# Patient Record
Sex: Female | Born: 1965 | Race: White | Hispanic: No | Marital: Married | Smoking: Never smoker
Health system: Southern US, Community
[De-identification: ages and names within clinical notes are randomized; demographics above are authoritative.]

## PROBLEM LIST (undated history)

## (undated) DIAGNOSIS — I10 Essential (primary) hypertension: Secondary | ICD-10-CM

## (undated) DIAGNOSIS — N63 Unspecified lump in unspecified breast: Secondary | ICD-10-CM

## (undated) DIAGNOSIS — N6099 Unspecified benign mammary dysplasia of unspecified breast: Principal | ICD-10-CM

## (undated) HISTORY — DX: Essential (primary) hypertension: I10

## (undated) HISTORY — DX: Unspecified lump in unspecified breast: N63.0

## (undated) HISTORY — DX: Unspecified benign mammary dysplasia of unspecified breast: N60.99

---

## 2007-07-08 HISTORY — PX: CHOLECYSTECTOMY: SHX55

## 2007-10-22 ENCOUNTER — Ambulatory Visit: Payer: Self-pay | Admitting: Unknown Physician Specialty

## 2007-10-27 ENCOUNTER — Ambulatory Visit: Payer: Self-pay | Admitting: Unknown Physician Specialty

## 2007-11-05 ENCOUNTER — Ambulatory Visit: Payer: Self-pay | Admitting: General Surgery

## 2007-11-10 ENCOUNTER — Ambulatory Visit: Payer: Self-pay | Admitting: General Surgery

## 2007-11-22 ENCOUNTER — Ambulatory Visit: Payer: Self-pay | Admitting: General Surgery

## 2009-11-06 ENCOUNTER — Ambulatory Visit: Payer: Self-pay

## 2009-12-23 ENCOUNTER — Ambulatory Visit: Payer: Self-pay | Admitting: Internal Medicine

## 2011-03-17 ENCOUNTER — Ambulatory Visit: Payer: Self-pay

## 2011-07-08 DIAGNOSIS — I10 Essential (primary) hypertension: Secondary | ICD-10-CM

## 2011-07-08 DIAGNOSIS — N6099 Unspecified benign mammary dysplasia of unspecified breast: Secondary | ICD-10-CM

## 2011-07-08 HISTORY — DX: Unspecified benign mammary dysplasia of unspecified breast: N60.99

## 2011-07-08 HISTORY — DX: Essential (primary) hypertension: I10

## 2012-03-11 ENCOUNTER — Emergency Department: Payer: Self-pay | Admitting: Emergency Medicine

## 2012-04-15 ENCOUNTER — Ambulatory Visit: Payer: Self-pay

## 2012-04-29 ENCOUNTER — Ambulatory Visit: Payer: Self-pay

## 2012-05-07 HISTORY — PX: BREAST BIOPSY: SHX20

## 2012-05-17 ENCOUNTER — Ambulatory Visit: Payer: Self-pay | Admitting: General Surgery

## 2012-05-20 LAB — PATHOLOGY REPORT

## 2012-06-11 ENCOUNTER — Ambulatory Visit: Payer: Self-pay | Admitting: General Surgery

## 2012-06-11 DIAGNOSIS — N6099 Unspecified benign mammary dysplasia of unspecified breast: Secondary | ICD-10-CM | POA: Insufficient documentation

## 2012-06-11 HISTORY — PX: BREAST SURGERY: SHX581

## 2012-06-11 HISTORY — PX: BREAST EXCISIONAL BIOPSY: SUR124

## 2012-11-08 ENCOUNTER — Encounter: Payer: Self-pay | Admitting: *Deleted

## 2012-11-15 ENCOUNTER — Telehealth: Payer: Self-pay | Admitting: *Deleted

## 2012-11-15 NOTE — Telephone Encounter (Signed)
Message copied by Nicholes Mango on Mon Nov 15, 2012  2:06 PM ------      Message from: [place], [place]      Created: Sun Nov 14, 2012  4:53 PM       Patient is due for f/u mammogram of the right breast in June, 2014. Review of 12/ 13 notes in [employer] showed she did not provide phone f/u in January as requested.  ------

## 2012-11-15 NOTE — Telephone Encounter (Signed)
Patient called back to let us know she is taking the Tamoxifen and aspirin. This patient states she forgot to call us in January 2014. She reports she has been having issue with night sweats but states the benefits outweigh the risks. Patient will follow up in the office as scheduled for June 2014.

## 2012-11-15 NOTE — Telephone Encounter (Signed)
Message has been left for the patient to call the office.

## 2012-11-15 NOTE — Telephone Encounter (Signed)
Patient reports that she is taking Tamoxifen as requested. Notice had been received from insurance that RX not filled. May have been less expensive w/o insurance at [facility]. F/U scheduled for next month.

## 2012-12-20 ENCOUNTER — Ambulatory Visit: Payer: Self-pay | Admitting: General Surgery

## 2012-12-28 ENCOUNTER — Encounter: Payer: Self-pay | Admitting: General Surgery

## 2012-12-28 ENCOUNTER — Ambulatory Visit: Payer: Self-pay | Admitting: General Surgery

## 2013-01-05 ENCOUNTER — Ambulatory Visit: Payer: Self-pay | Admitting: General Surgery

## 2013-01-20 ENCOUNTER — Encounter: Payer: Self-pay | Admitting: *Deleted

## 2013-01-31 ENCOUNTER — Ambulatory Visit: Payer: Self-pay | Admitting: General Surgery

## 2013-02-28 ENCOUNTER — Encounter: Payer: Self-pay | Admitting: General Surgery

## 2013-02-28 ENCOUNTER — Ambulatory Visit (INDEPENDENT_AMBULATORY_CARE_PROVIDER_SITE_OTHER): Payer: BC Managed Care – PPO | Admitting: General Surgery

## 2013-02-28 VITALS — BP 120/80 | HR 76 | Resp 12 | Ht 62.0 in | Wt 153.0 lb

## 2013-02-28 DIAGNOSIS — N6099 Unspecified benign mammary dysplasia of unspecified breast: Secondary | ICD-10-CM

## 2013-02-28 DIAGNOSIS — N6089 Other benign mammary dysplasias of unspecified breast: Secondary | ICD-10-CM

## 2013-02-28 NOTE — Patient Instructions (Signed)
Patient to return in six months bilateral mammogram.

## 2013-02-28 NOTE — Progress Notes (Signed)
Patient ID: Stephanie Warner, female   DOB: Jul 05, 1966, 47 y.o.   MRN: [id number]  No chief complaint on file.   HPI Stephanie Warner is a 47 y.o. female who presents for a breast evaluation. The most recent right breast mammogram was done on 12/28/12 cat 2. Patient does perform regular self breast checks and gets regular mammograms done.   The patient had previously undergone a stereotactic biopsy showing ADH. She subsequently underwent wide excision on June 11, 2012. No upstaging was noted.  The patient reports moderate vasomotor symptoms.  HPI  Past Medical History  Diagnosis Date  . Hypertension 2013  . Lump or mass in breast     Past Surgical History  Procedure Laterality Date  . Cholecystectomy  2009    Family History  Problem Relation Age of Onset  . Breast cancer Maternal Aunt 60  . Breast cancer Paternal Aunt 58    Social History History  Substance Use Topics  . Smoking status: Never Smoker   . Smokeless tobacco: Never Used  . Alcohol Use: Yes    No Known Allergies  Current Outpatient Prescriptions  Medication Sig Dispense Refill  . aspirin 81 MG tablet Take 81 mg by mouth daily.      . AZOR 5-40 MG per tablet Take 1 tablet by mouth daily.      . tamoxifen (NOLVADEX) 20 MG tablet Take 1 tablet by mouth daily at 6 (six) AM.       No current facility-administered medications for this visit.    Review of Systems Review of Systems  Constitutional: Negative.   Respiratory: Negative.   Cardiovascular: Negative.     Blood pressure 120/80, pulse 76, resp. rate 12, height 5\' 2"  (1.575 m), weight 153 lb (69.4 kg).  Physical Exam Physical Exam  Vitals reviewed. Constitutional: She is oriented to person, place, and time. She appears well-developed and well-nourished.  Cardiovascular: Normal rate, regular rhythm and normal heart sounds.   Pulmonary/Chest: Breath sounds normal. Right breast exhibits no inverted nipple, no mass, no nipple discharge, no skin  change and no tenderness. Left breast exhibits no inverted nipple, no mass, no nipple discharge, no skin change and no tenderness.  Right breast well healed incision 9 o'clock.  Lymphadenopathy:    She has no cervical adenopathy.    She has no axillary adenopathy.  Neurological: She is alert and oriented to person, place, and time.  Skin: Skin is warm and dry.    Data Reviewed Right breast mammogram dated December 28, 2012 showed no interval change. Scattered microcalcifications are noted in the upper outer quadrant. BI-RAD-2.  Assessment    Atypical ductal hyperplasia, on chemoprevention with tamoxifen.     Plan    All in all the patient is doing well. No indication for for change to Evista at this time.   We'll arrange for bilateral diagnostic mammograms in December 2014 with a brief office visit to follow.       Earline Mayotte 02/28/2013, 10:11 PM

## 2013-03-10 ENCOUNTER — Encounter: Payer: Self-pay | Admitting: General Surgery

## 2013-07-13 ENCOUNTER — Other Ambulatory Visit: Payer: Self-pay | Admitting: General Surgery

## 2013-07-16 IMAGING — US ULTRASOUND RIGHT BREAST
1 series · 14 of 25 positions shown · non-contrast
Comparison: none

REASON FOR EXAM: av rt nocularity and calcs
COMMENTS:

[Series 1: ultrasound right breast · 0.08mm/px · 14 of 40 slices shown]
[im 1/40]
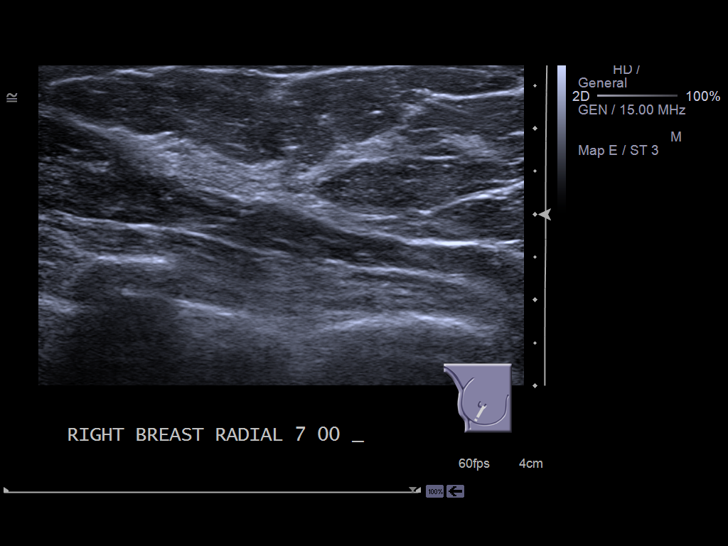
[im 4/40]
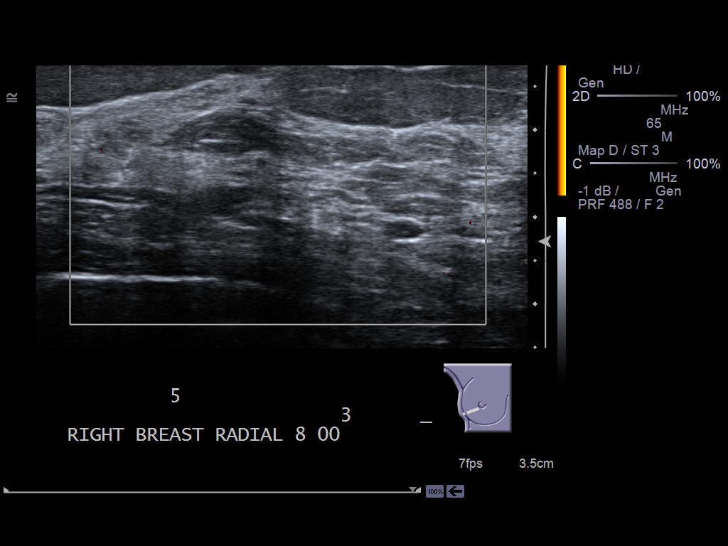
[im 7/40]
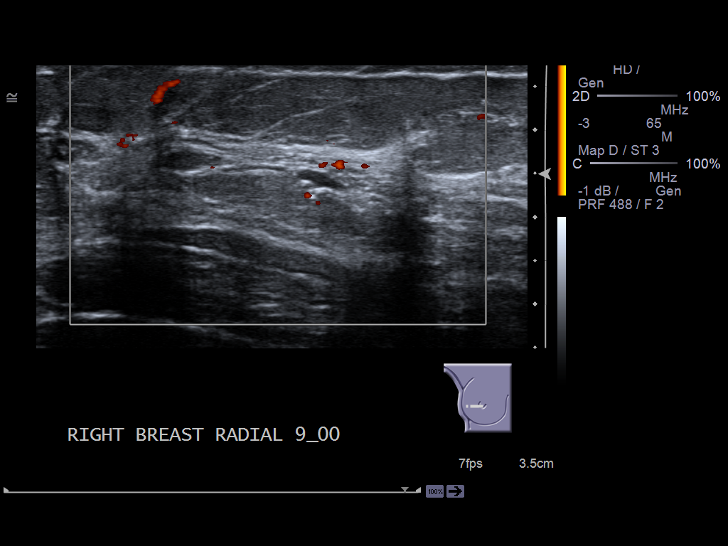
[im 10/40]
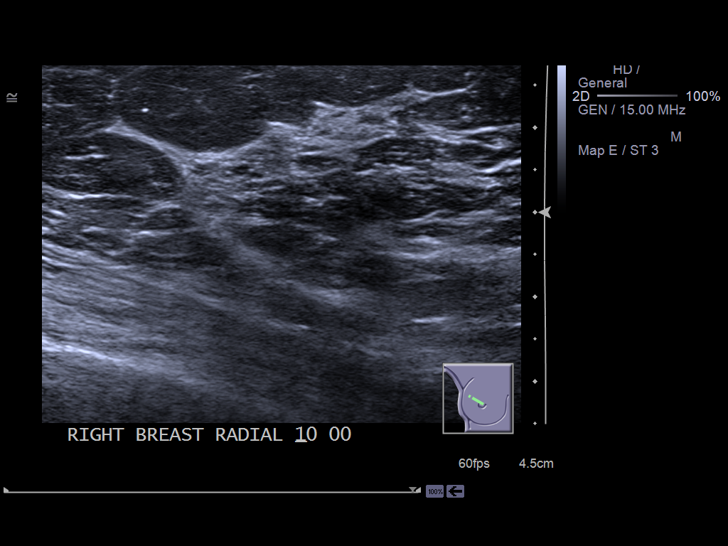
[im 14/40]
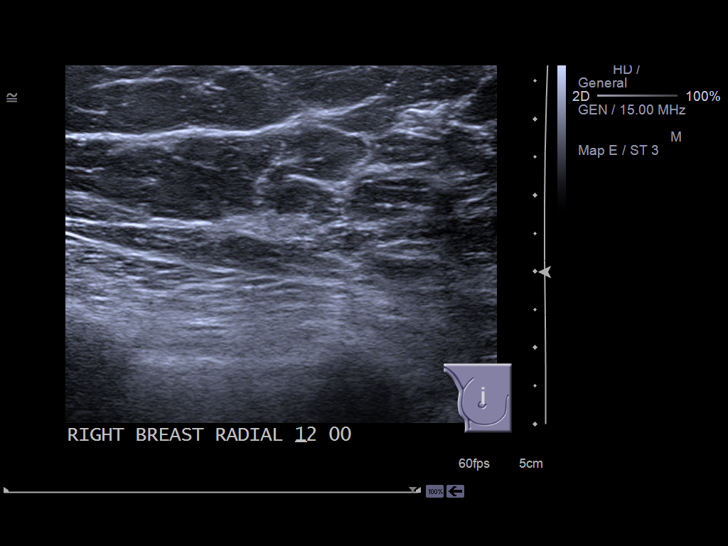
[im 15/40]
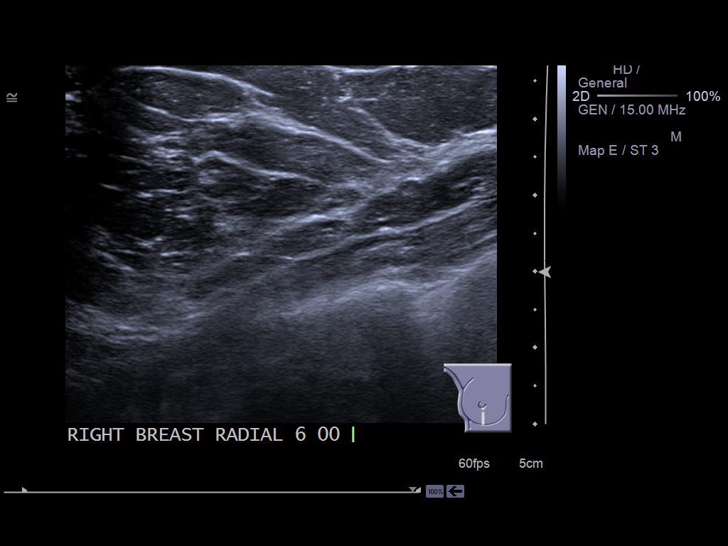
[im 18/40]
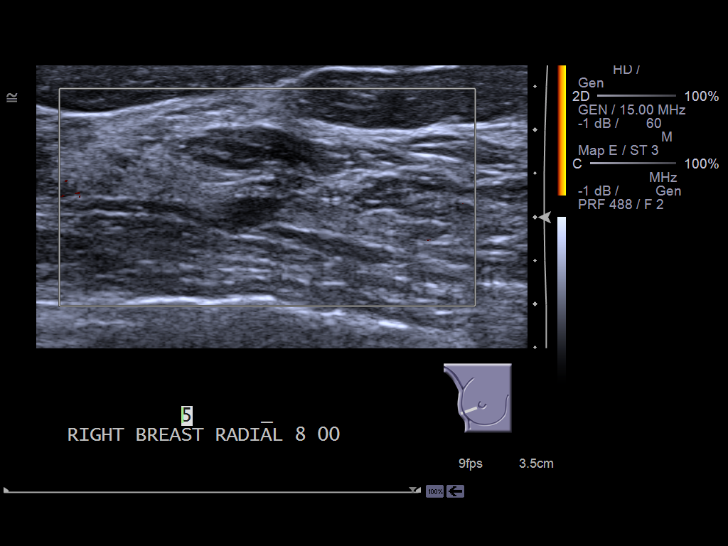
[im 22/40]
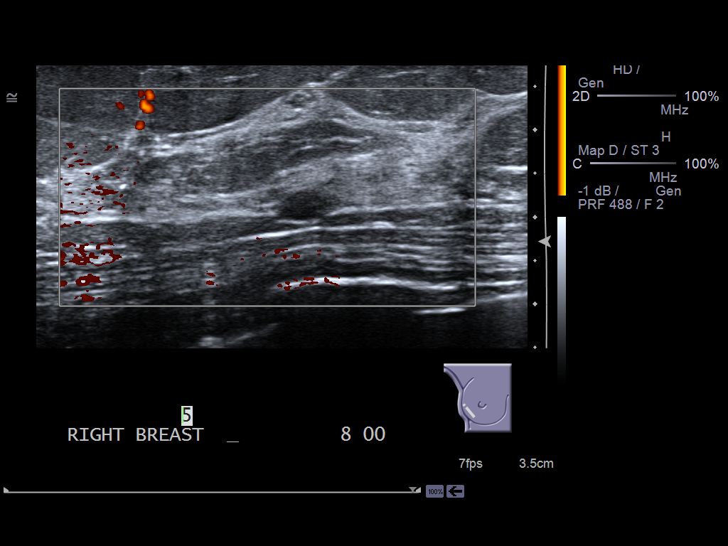
[im 25/40]
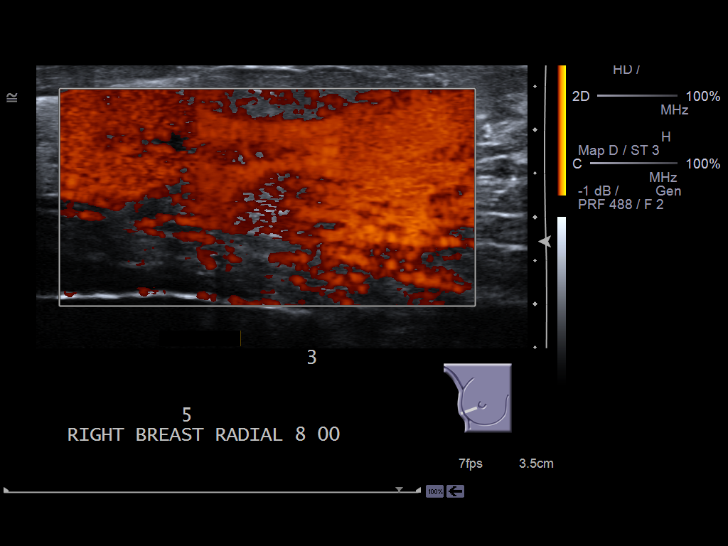
[im 27/40]
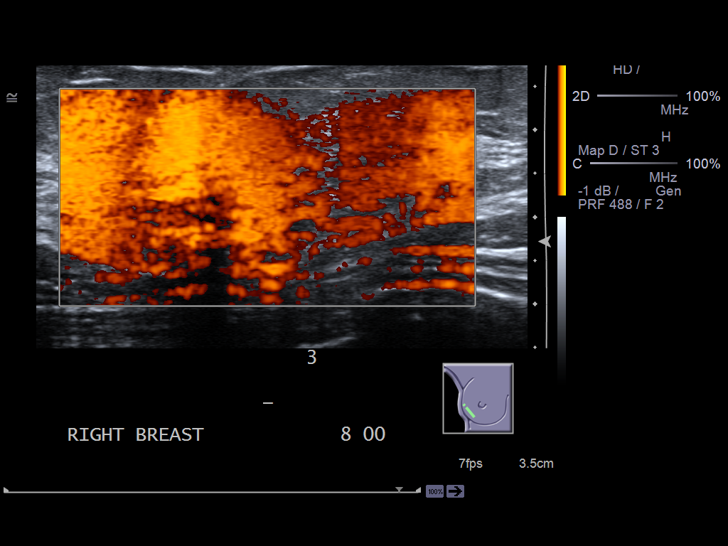
[im 30/40]
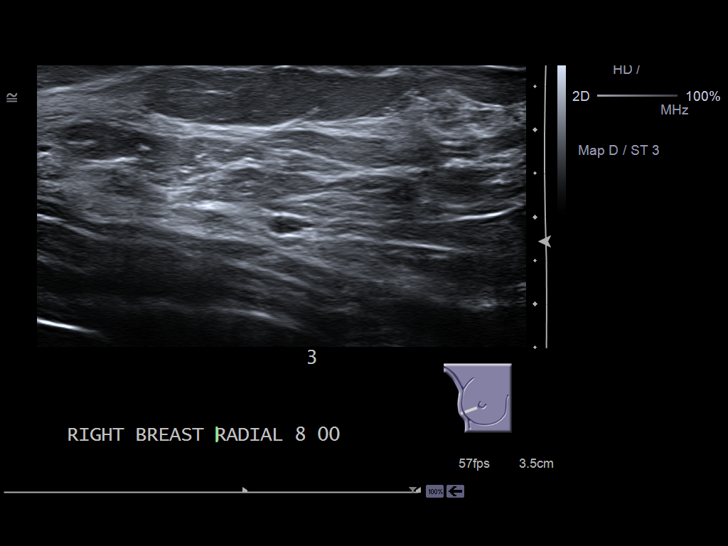
[im 33/40]
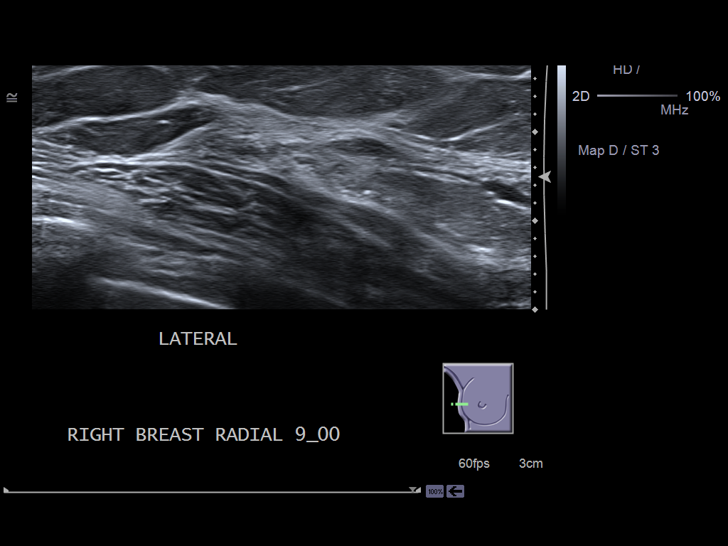
[im 36/40]
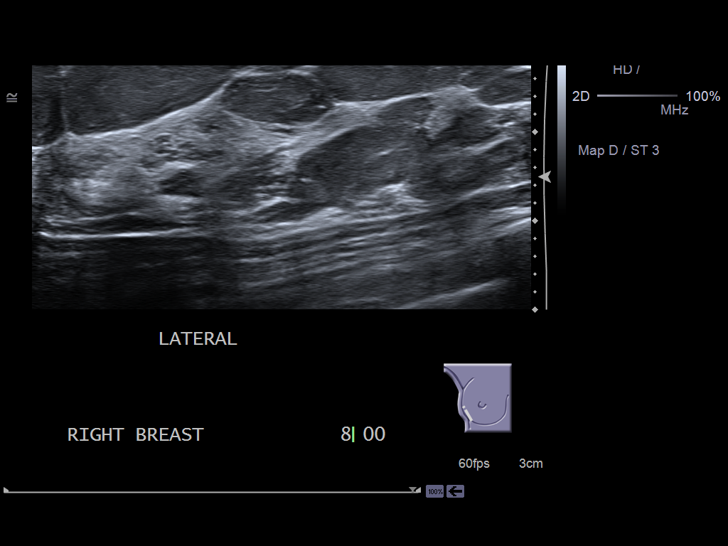
[im 40/40]
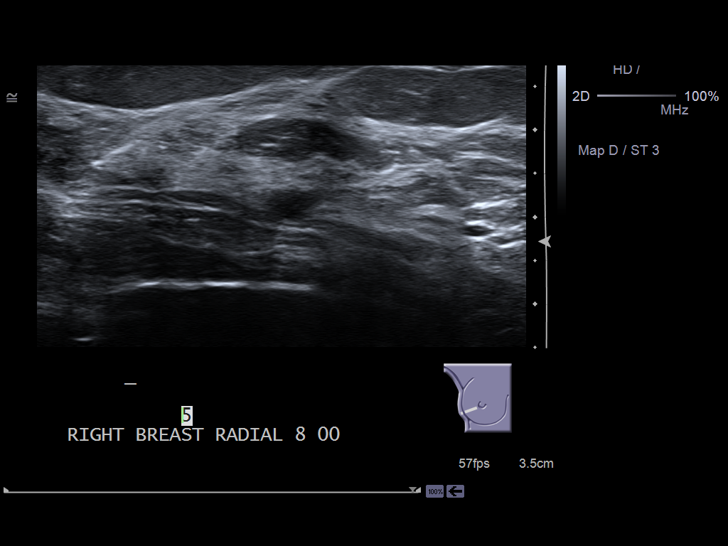

[14 of 25 positions shown; findings below may reference images not displayed]

PROCEDURE:     US  - US BREAST RIGHT  - April 29, 2012 [DATE]

RESULT:     The patient underwent ultrasound directed to the lateral aspect
of the right breast. At the [DATE] position there are 2 hypoechoic
nonshadowing nodules demonstrated. These are ovoid and appear broader than
they are tall. One measures 7 x 7 x 4 mm. The second measures 4 x 5 x 2 mm.
IMPRESSION: There are are indeterminate hypoechoic nodules in the
lateral aspect of the right breast. Please see the dictation of the
diagnostic mammogram of the same day for final recommendations BI-RADS
classification.

Dictation site:1

## 2013-07-19 ENCOUNTER — Ambulatory Visit: Payer: Self-pay | Admitting: General Surgery

## 2013-07-20 ENCOUNTER — Encounter: Payer: Self-pay | Admitting: General Surgery

## 2013-07-22 ENCOUNTER — Telehealth: Payer: Self-pay | Admitting: *Deleted

## 2013-07-22 NOTE — Telephone Encounter (Signed)
Patient notified as instructed. We will see her in the office on 08-03-13 for evaluation after additional [facility] which [facility].   She is penciled in for a stereo biopsy [facility]. We will wait to schedule with [facility] once Dr. Lemar LivingsByrnett sees the patient.

## 2013-07-22 NOTE — Telephone Encounter (Signed)
Left message on home and cell numbers for patient to call the office.  Mar[facility]mand[facility] at [facility] has scheduled patient's additional views for 07-28-13 at 8:40 am. Patient is currently on the schedule for an appointment on Monday, 07-25-13.   We could possibly complete stereo biopsy on 08-08-13 if patient agreeable.

## 2013-07-22 NOTE — Telephone Encounter (Signed)
Message copied by Nicholes MangoBAILEY, Letonya Mangels J on Fri Jul 22, 2013 10:56 AM ------      Message from: [place]      Created: Fri Jul 22, 2013  9:03 AM       [facility] and see if [facility] can do additional views on Monday, Jan 19 AM. Notify patient I think she will need a repeat stereo biopsy, which could be done Monday, PM. If they [facility] is unable to do the additional views Monday AM, and the patient wants to proceed with biopsy, that would be OK.  ------

## 2013-07-25 ENCOUNTER — Ambulatory Visit: Payer: BC Managed Care – PPO | Admitting: General Surgery

## 2013-07-27 ENCOUNTER — Ambulatory Visit: Payer: Self-pay | Admitting: General Surgery

## 2013-07-28 ENCOUNTER — Encounter: Payer: Self-pay | Admitting: General Surgery

## 2013-08-03 ENCOUNTER — Ambulatory Visit (INDEPENDENT_AMBULATORY_CARE_PROVIDER_SITE_OTHER): Payer: BC Managed Care – PPO | Admitting: General Surgery

## 2013-08-03 ENCOUNTER — Encounter: Payer: Self-pay | Admitting: General Surgery

## 2013-08-03 VITALS — BP 160/88 | HR 86 | Resp 14 | Ht 62.0 in | Wt 165.0 lb

## 2013-08-03 DIAGNOSIS — N6099 Unspecified benign mammary dysplasia of unspecified breast: Secondary | ICD-10-CM

## 2013-08-03 DIAGNOSIS — N6089 Other benign mammary dysplasias of unspecified breast: Secondary | ICD-10-CM

## 2013-08-03 NOTE — Progress Notes (Signed)
Patient ID: Stephanie Warner, female   DOB: 1966/06/07, 48 y.o.   MRN: [id number]  Chief Complaint  Patient presents with  . Follow-up    bilateral diagnostic mammogram     HPI Stephanie Warner is a 48 y.o. female who presents for a follow up breast exam. Her most recent mammogram was done on 07/27/13. The patient does self breast checks regularly and gets regular mammograms done. Besides soreness she denies any other issues with her breasts at this time.   HPI  Past Medical History  Diagnosis Date  . Hypertension 2013  . Lump or mass in breast   . Atypical ductal hyperplasia of breast 2013    Past Surgical History  Procedure Laterality Date  . Cholecystectomy  2009  . Breast surgery Right 06-11-12    ADH, columnar cell change. Margins were clear. Biopsy prompted by stereotactic biopsy showing ADH.    Family History  Problem Relation Age of Onset  . Breast cancer Maternal Aunt 60  . Breast cancer Paternal Aunt 30    Social History History  Substance Use Topics  . Smoking status: Never Smoker   . Smokeless tobacco: Never Used  . Alcohol Use: Yes    No Known Allergies  Current Outpatient Prescriptions  Medication Sig Dispense Refill  . amoxicillin (AMOXIL) 875 MG tablet       . aspirin 81 MG tablet Take 81 mg by mouth daily.      . AZOR 5-40 MG per tablet Take 1 tablet by mouth daily.      . chlorhexidine (PERIDEX) 0.12 % solution       . fluconazole (DIFLUCAN) 150 MG tablet       . tamoxifen (NOLVADEX) 20 MG tablet Take one tablet by mouth one time daily  30 tablet  10   No current facility-administered medications for this visit.    Review of Systems Review of Systems  Constitutional: Negative.   Respiratory: Negative.   Cardiovascular: Negative.     Blood pressure 160/88, pulse 86, resp. rate 14, height 5\' 2"  (1.575 m), weight 165 lb (74.844 kg).  Physical Exam Physical Exam  Constitutional: She is oriented to person, place, and time. She appears  well-developed and well-nourished.  Neck: Neck supple. No thyromegaly present.  Cardiovascular: Normal rate, regular rhythm and normal heart sounds.   No murmur heard. Pulmonary/Chest: Effort normal and breath sounds normal. Right breast exhibits no inverted nipple, no mass, no nipple discharge, no skin change and no tenderness. Left breast exhibits no inverted nipple, no mass, no nipple discharge, no skin change and no tenderness.  Lymphadenopathy:    She has no cervical adenopathy.    She has no axillary adenopathy.  Neurological: She is alert and oriented to person, place, and time.  Skin: Skin is warm and dry.    Data Reviewed Bilateral mammograms dated July 19, 2013 were reviewed. Microcalcifications were identified in the right breast for which additional views were requested.  Focal spot compression views of the right breast a July 27, 2013 were reviewed. This showed a 2 cm group of microcalcifications in the breast new from June 2014. Dystrophic appearance. Impression was probably benign breast calcifications representing fat necrosis, BI-RAD-3.  Independent review the films shows a marked change over the past 6 months. It would be unusual for this to develop in a short interval of time in the 6-12 months after her original wide excision. I'm concerned about possible additional ADH.  Assessment    New right breast  calcifications in area of previous ADH.     Plan    The patient was aware that there is a discordance between the radiologist's interpretation my note. Options for management include 6 month followup vs  repeat stereotactic biopsy.  At this time the patient desires to proceed repeat biopsy.  We'll plan to schedule this at the next available date (August 08, 2013).        Stephanie MayotteByrnett, Stephanie Warner W 08/03/2013, 10:03 PM

## 2013-08-03 NOTE — Patient Instructions (Signed)
Patient to proceed with stereotactic breast biopsy. Patient to contact our office with any questions or concerns.

## 2013-08-08 ENCOUNTER — Ambulatory Visit: Payer: Self-pay | Admitting: General Surgery

## 2013-08-08 DIAGNOSIS — R920 Mammographic microcalcification found on diagnostic imaging of breast: Secondary | ICD-10-CM

## 2013-08-08 HISTORY — PX: BREAST BIOPSY: SHX20

## 2013-08-10 ENCOUNTER — Telehealth: Payer: Self-pay | Admitting: General Surgery

## 2013-08-10 LAB — PATHOLOGY REPORT

## 2013-08-10 NOTE — Telephone Encounter (Signed)
Message left biopsy OK, would review at follow up appt.

## 2013-08-11 ENCOUNTER — Encounter: Payer: Self-pay | Admitting: General Surgery

## 2013-08-11 ENCOUNTER — Ambulatory Visit: Payer: BC Managed Care – PPO | Admitting: General Surgery

## 2013-08-15 ENCOUNTER — Ambulatory Visit (INDEPENDENT_AMBULATORY_CARE_PROVIDER_SITE_OTHER): Payer: BC Managed Care – PPO | Admitting: *Deleted

## 2013-08-15 DIAGNOSIS — N6089 Other benign mammary dysplasias of unspecified breast: Secondary | ICD-10-CM

## 2013-08-15 DIAGNOSIS — N6099 Unspecified benign mammary dysplasia of unspecified breast: Secondary | ICD-10-CM

## 2013-08-15 NOTE — Progress Notes (Signed)
Patient here today for follow up post breast biopsy.  Dressing removed, steristrip in place and aware it may come off in one week.  Minimal bruising noted.  The patient is aware that a heating pad may be used for comfort as needed.  Aware of pathology. Follow up as scheduled.  

## 2013-08-15 NOTE — Patient Instructions (Signed)
Patient to return in 6 months with right breast diagnostic mammogram.

## 2013-08-22 ENCOUNTER — Encounter: Payer: Self-pay | Admitting: General Surgery

## 2014-01-11 ENCOUNTER — Ambulatory Visit: Payer: BC Managed Care – PPO | Admitting: General Surgery

## 2014-01-31 ENCOUNTER — Ambulatory Visit: Payer: BC Managed Care – PPO | Admitting: General Surgery

## 2014-02-20 ENCOUNTER — Encounter: Payer: Self-pay | Admitting: General Surgery

## 2014-02-21 ENCOUNTER — Ambulatory Visit: Payer: BC Managed Care – PPO | Admitting: General Surgery

## 2014-02-23 ENCOUNTER — Ambulatory Visit: Payer: BC Managed Care – PPO | Admitting: General Surgery

## 2014-03-09 ENCOUNTER — Encounter: Payer: Self-pay | Admitting: General Surgery

## 2014-03-09 ENCOUNTER — Ambulatory Visit (INDEPENDENT_AMBULATORY_CARE_PROVIDER_SITE_OTHER): Payer: BC Managed Care – PPO | Admitting: General Surgery

## 2014-03-09 VITALS — BP 140/82 | HR 72 | Resp 14 | Ht 62.0 in | Wt 157.0 lb

## 2014-03-09 DIAGNOSIS — N6099 Unspecified benign mammary dysplasia of unspecified breast: Secondary | ICD-10-CM

## 2014-03-09 DIAGNOSIS — N6089 Other benign mammary dysplasias of unspecified breast: Secondary | ICD-10-CM

## 2014-03-09 NOTE — Patient Instructions (Signed)
The patient has been asked to return to the office in six months a bilateral diagnostic mammogram.

## 2014-03-09 NOTE — Progress Notes (Signed)
Patient ID: Stephanie Warner, female   DOB: [birth year], 48 y.o.   MRN: [id number]  Chief Complaint  Patient presents with  . Follow-up    mammogram    HPI Stephanie Warner is a 48 y.o. female who presents for a breast evaluation. The most recent right breast  mammogram was done on 02/16/14. Patient does perform regular self breast checks and gets regular mammograms done.  The patient continues to tolerate tamoxifen therapy well.  HPI  Past Medical History  Diagnosis Date  . Hypertension 2013  . Lump or mass in breast   . Atypical ductal hyperplasia of breast 2013    Past Surgical History  Procedure Laterality Date  . Cholecystectomy  2009  . Breast surgery Right 06-11-12    ADH, columnar cell change. Margins were clear. Biopsy prompted by stereotactic biopsy showing ADH.    Family History  Problem Relation Age of Onset  . Breast cancer Maternal Aunt 60  . Breast cancer Paternal Aunt 57    Social History History  Substance Use Topics  . Smoking status: Never Smoker   . Smokeless tobacco: Never Used  . Alcohol Use: Yes    No Known Allergies  Current Outpatient Prescriptions  Medication Sig Dispense Refill  . aspirin 81 MG tablet Take 81 mg by mouth daily.      . AZOR 5-40 MG per tablet Take 1 tablet by mouth daily.      . chlorhexidine (PERIDEX) 0.12 % solution       . fluconazole (DIFLUCAN) 150 MG tablet       . tamoxifen (NOLVADEX) 20 MG tablet Take one tablet by mouth one time daily  30 tablet  10   No current facility-administered medications for this visit.    Review of Systems Review of Systems  Constitutional: Negative.   Respiratory: Negative.   Cardiovascular: Negative.     Blood pressure 140/82, pulse 72, resp. rate 14, height  (1.575 m), weight 157 lb (71.215 kg).  Physical Exam Physical Exam  Constitutional: She is oriented to person, place, and time. She appears well-developed and well-nourished.  Eyes: Conjunctivae are normal. No  scleral icterus.  Neck: Neck supple.  Cardiovascular: Normal rate, regular rhythm and normal heart sounds.   Pulmonary/Chest: Effort normal and breath sounds normal. Right breast exhibits no inverted nipple, no mass, no nipple discharge, no skin change and no tenderness. Left breast exhibits no inverted nipple, no mass, no nipple discharge, no skin change and no tenderness.  Lymphadenopathy:    She has no cervical adenopathy.    She has no axillary adenopathy.  Neurological: She is alert and oriented to person, place, and time.  Skin: Skin is warm and dry.    Data Reviewed Right breast mammogram dated R. Is 13, 2015 shows a decrease in mammographic microcalcifications. Biopsy site is mammographically stable. BIRADS 4 based on previous identification of ADH.  This area has previously been resected with the findings of only a millimeter area of residual ADH. Reexcision is not indicated.  Assessment    Benign breast exam.     Plan    We'll plan for follow up examination with bilateral mammograms in 6 months.     PCP/Ref: Yves Dill 03/10/2014, 7:22 PM

## 2014-05-08 ENCOUNTER — Encounter: Payer: Self-pay | Admitting: General Surgery

## 2014-08-01 ENCOUNTER — Other Ambulatory Visit: Payer: Self-pay | Admitting: General Surgery

## 2014-08-28 ENCOUNTER — Encounter: Payer: Self-pay | Admitting: General Surgery

## 2014-08-29 ENCOUNTER — Ambulatory Visit: Payer: Self-pay | Admitting: General Surgery

## 2014-09-12 ENCOUNTER — Ambulatory Visit: Payer: Self-pay | Admitting: General Surgery

## 2014-09-20 ENCOUNTER — Ambulatory Visit: Payer: Self-pay | Admitting: General Surgery

## 2014-10-04 ENCOUNTER — Ambulatory Visit: Payer: Self-pay | Admitting: General Surgery

## 2014-10-24 NOTE — Op Note (Signed)
PATIENT NAME:  [name], Stephanie C MR#:  [id number] DATE OF BIRTH:  1965/07/24  DATE OF PROCEDURE:  06/11/2012  PREOPERATIVE DIAGNOSIS: Abnormal right breast biopsy with atypical intraductal proliferative lesion.  POSTOPERATIVE DIAGNOSIS: Abnormal right breast biopsy with atypical intraductal proliferative lesion.   OPERATIVE PROCEDURE: Wide local excision of the right breast to evaluate for DCIS.   OPERATING SURGEON: Earline MayotteJeffrey W. Byrnett, MD   ANESTHESIA: General endotracheal under Dr. Henrene HawkingKephart, Marcaine 0.5% plain, 30 mL local infiltration.   ESTIMATED BLOOD LOSS: Minimal.   CLINICAL NOTE: This is a 49 year old woman who underwent a stereotactic biopsy for microcalcifications. This showed an atypical intraductal proliferative lesion as well as associated luminal calcifications and columnar cell hyperplasia. 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 Wide excision was planned to clarify the pathology.   OPERATIVE NOTE: Ultrasound was used to confirm the area of the previous biopsy. ChloraPrep was applied and Marcaine infiltrated. The area was approached through a radial incision at the 9 o'clock position. A 3 x 3 x 4 cm block of tissue was excised, orientated, and specimen radiograph confirmed the previously placed biopsy clip in the lateral aspect of the tissue block. This was confirmed by the pathologist. Permanent sections were requested. The defect was closed in layers with running 2-0 Vicryl. The skin was closed with a running 4-0 Vicryl subcuticular suture. Benzoin and Steri-Strips followed by Telfa pad were applied. Fluff gauze, Kerlix, and an Ace wrap were then applied. The patient tolerated the procedure well and was taken to the recovery room in stable condition.  ____________________________ Earline MayotteJeffrey W. Byrnett, MD jwb:drc D: 06/11/2012 12:38:14 ET T: 06/11/2012 12:46:26 ET JOB#: [id number] cc: Earline MayotteJeffrey W. Byrnett, MD, <Dictator>, Danella PentonMark F. Miller, MD,  Cydney OkAngela R. Lugiano, CNM JEFFREY Brion AlimentW BYRNETT MD ELECTRONICALLY SIGNED 06/14/2012 19:46

## 2014-11-01 ENCOUNTER — Encounter: Payer: Self-pay | Admitting: Podiatry

## 2014-11-01 ENCOUNTER — Ambulatory Visit (INDEPENDENT_AMBULATORY_CARE_PROVIDER_SITE_OTHER): Payer: BLUE CROSS/BLUE SHIELD | Admitting: Podiatry

## 2014-11-01 ENCOUNTER — Ambulatory Visit (INDEPENDENT_AMBULATORY_CARE_PROVIDER_SITE_OTHER): Payer: BLUE CROSS/BLUE SHIELD

## 2014-11-01 VITALS — BP 134/76 | HR 86 | Resp 16

## 2014-11-01 DIAGNOSIS — S99921A Unspecified injury of right foot, initial encounter: Secondary | ICD-10-CM

## 2014-11-01 DIAGNOSIS — S9031XA Contusion of right foot, initial encounter: Secondary | ICD-10-CM

## 2014-11-01 NOTE — Progress Notes (Signed)
   Subjective:    Patient ID: Stephanie MaskerMarcella C Warner, female    DOB: 12/24/1965, 49 y.o.   MRN: [id number]  HPI Comments: "I hurt my foot"  Patient c/o aching dorsal foot right since Saturday (50 days). She was helping move wood and a piece dropped on top of her foot. The area is bruised and swollen. She is wearing a compression stocking and has been taking Ibuprofen and icing it.      Review of Systems  Musculoskeletal: Positive for back pain.  Allergic/Immunologic: Positive for environmental allergies.  All other systems reviewed and are negative.      Objective:   Physical Exam: I have reviewed her past medical history medications allergies surgery social history review of systems. Pulses are strongly palpable bilateral. Neurologic sensorium is intact per Semmes-Weinstein monofilament. Deep tendon reflexes are intact bilateral muscle strength +5 over 5 dorsiflexion plantar flexors and inverters and everters all intrinsic musculature is intact. Orthopedic evaluation does demonstrate a small hematoma approximately 4 cm in diameter which is nonpulsatile but is firm to the touch to the dorsal aspect overlying the neck of the talus and distal calcaneus. An area of bruising is noted to this site extending past the level of the ankle proximally. Radiographic evaluation does not demonstrates any type of osseus abnormalities in this area. She has full range of motion of all of her tendons and all of her joints without symptoms.        Assessment & Plan:  Assessment: Contusion dorsal aspect of the right foot.  Plan: Encouraged compression rest ice and elevation.

## 2014-12-06 ENCOUNTER — Encounter: Payer: Self-pay | Admitting: *Deleted

## 2015-05-09 ENCOUNTER — Ambulatory Visit: Payer: Self-pay | Admitting: General Surgery

## 2015-06-14 ENCOUNTER — Ambulatory Visit: Payer: Self-pay | Admitting: General Surgery

## 2015-08-07 ENCOUNTER — Other Ambulatory Visit: Payer: Self-pay | Admitting: *Deleted

## 2015-08-07 DIAGNOSIS — N6091 Unspecified benign mammary dysplasia of right breast: Secondary | ICD-10-CM

## 2015-08-13 ENCOUNTER — Other Ambulatory Visit: Payer: Self-pay | Admitting: *Deleted

## 2015-08-13 ENCOUNTER — Inpatient Hospital Stay
Admission: RE | Admit: 2015-08-13 | Discharge: 2015-08-13 | Disposition: A | Discharge status: Home / Self Care | Payer: Self-pay | Source: Ambulatory Visit | Attending: *Deleted | Admitting: *Deleted

## 2015-08-13 DIAGNOSIS — Z9289 Personal history of other medical treatment: Secondary | ICD-10-CM

## 2015-08-15 ENCOUNTER — Encounter: Payer: Self-pay | Admitting: *Deleted

## 2015-10-08 ENCOUNTER — Ambulatory Visit
Admission: RE | Admit: 2015-10-08 | Discharge: 2015-10-08 | Disposition: A | Discharge status: Home / Self Care | Payer: BLUE CROSS/BLUE SHIELD | Source: Ambulatory Visit | Attending: General Surgery | Admitting: General Surgery

## 2015-10-08 ENCOUNTER — Other Ambulatory Visit: Payer: Self-pay | Admitting: General Surgery

## 2015-10-08 DIAGNOSIS — R921 Mammographic calcification found on diagnostic imaging of breast: Secondary | ICD-10-CM | POA: Diagnosis not present

## 2015-10-08 DIAGNOSIS — N6091 Unspecified benign mammary dysplasia of right breast: Secondary | ICD-10-CM | POA: Insufficient documentation

## 2015-10-16 ENCOUNTER — Encounter: Payer: Self-pay | Admitting: General Surgery

## 2015-10-16 ENCOUNTER — Ambulatory Visit: Payer: BLUE CROSS/BLUE SHIELD | Admitting: General Surgery

## 2015-10-16 ENCOUNTER — Ambulatory Visit (INDEPENDENT_AMBULATORY_CARE_PROVIDER_SITE_OTHER): Payer: BLUE CROSS/BLUE SHIELD | Admitting: General Surgery

## 2015-10-16 VITALS — BP 144/78 | HR 74 | Resp 12 | Ht 62.0 in | Wt 150.0 lb

## 2015-10-16 DIAGNOSIS — N62 Hypertrophy of breast: Secondary | ICD-10-CM | POA: Diagnosis not present

## 2015-10-16 DIAGNOSIS — N6099 Unspecified benign mammary dysplasia of unspecified breast: Secondary | ICD-10-CM

## 2015-10-16 NOTE — Patient Instructions (Signed)
The patient has been asked to return to the office in one year with a bilateral diagnostic mammogram. 

## 2015-10-16 NOTE — Progress Notes (Addendum)
Patient ID: Stephanie Warner, female   DOB: Jan 28, 1966, 50 y.o.   MRN: [id number]  Chief Complaint  Patient presents with  . Follow-up    mammmogram     HPI Stephanie Warner is a 50 y.o. female who presents for a breast evaluation. The most recent mammogram was done on 10/08/15.  Patient does perform regular self breast checks and gets regular mammograms done. Doing well on Tamoxifen.   I personally reviewed the patient's history.  HPI  Past Medical History  Diagnosis Date  . Hypertension 2013  . Lump or mass in breast   . Atypical ductal hyperplasia of breast 2013    Past Surgical History  Procedure Laterality Date  . Cholecystectomy  2009  . Breast excisional biopsy Right 06/2012    surgery  . Breast surgery Right 06-11-12    ADH, columnar cell change. Margins were clear. Biopsy prompted by stereotactic biopsy showing ADH.  Marland Kitchen Breast biopsy Right 05/2012    atypical ductal hyperplasia  . Breast biopsy Right 08/2013    ADH ( 1mm) on stereo biopsy. No wide excision.     Family History  Problem Relation Age of Onset  . Breast cancer Maternal Aunt 60  . Breast cancer Paternal Aunt 67    Social History Social History  Substance Use Topics  . Smoking status: Never Smoker   . Smokeless tobacco: Never Used  . Alcohol Use: Yes    Allergies  Allergen Reactions  . Sulfa Antibiotics Rash    Current Outpatient Prescriptions  Medication Sig Dispense Refill  . amLODipine-olmesartan (AZOR) 10-40 MG tablet Take 1 tablet by mouth daily.    Marland Kitchen aspirin 81 MG tablet Take 81 mg by mouth daily.    Marland Kitchen Fexofenadine HCl (ALLEGRA PO) Take by mouth.    Marland Kitchen omeprazole (PRILOSEC) 20 MG capsule Take 20 mg by mouth daily.    . sertraline (ZOLOFT) 50 MG tablet Take 50 mg by mouth daily.    . tamoxifen (NOLVADEX) 20 MG tablet TAKE ONE TABLET BY MOUTH ONE TIME DAILY  30 tablet 9   No current facility-administered medications for this visit.    Review of Systems Review of Systems   Constitutional: Negative.   Respiratory: Negative.   Cardiovascular: Negative.     Blood pressure 144/78, pulse 74, resp. rate 12, height  (1.575 m), weight 150 lb (68.04 kg).  Physical Exam Physical Exam  Constitutional: She is oriented to person, place, and time. She appears well-developed and well-nourished.  Eyes: Conjunctivae are normal. No scleral icterus.  Neck: Neck supple.  Cardiovascular: Normal rate and regular rhythm.   Murmur heard.  Systolic murmur is present with a grade of 2/6  Pulmonary/Chest: Effort normal and breath sounds normal. Right breast exhibits no inverted nipple, no mass, no nipple discharge, no skin change and no tenderness. Left breast exhibits no inverted nipple, no mass, no nipple discharge, no skin change and no tenderness.  Right breast well healed incision at 9 o'clock  Lymphadenopathy:    She has no cervical adenopathy.    She has no axillary adenopathy.  Neurological: She is alert and oriented to person, place, and time.  Skin: Skin is warm and dry.    Data Reviewed PATHOLOGY REPORT  Pathology Report  .                [  Final Report     ]           Material submitted:                    .  RIGHT BREAST BIOPSY  .                [  Final Report     ]           Pre-operative diagnosis:                    .  RIGHT BREAST CALCS  .                [  Final Report     ]             Diagnosis:  RIGHT BREAST BIOPSY:  - SINGLE MINUTE (<1 MM) FOCUS OF ATYPICAL DUCTAL HYPERPLASIA. -  CHANGES CONSISTENT WITH PREVIOUS SURGICAL PROCEDURE WITH  ASSOCIATED MICROCALCIFICATIONS.  - NO EVIDENCE OF MALIGNANCY.  .  NOTE: Preliminary results of this case were discussed with Dr.  Lemar LivingsByrnett on August 09, 2013 by Dr. Excell SeltzerBaker. The final results are  unchanged.  XDB/08/10/2013   Assessment    Benign breast exam.    Plan     The radiologist had spoken to the patient about the fact that excisional biopsy was not completed after the stereotactic biopsy of February 2015 identifying a minute foci of ADH area considering the volume of tissue removed during the biopsy and the tiny area of ADH, I felt that the likelihood of upstaging to DCIS was incredibly small.  The patient is now discussing with her family whether she would be more comfortable having an excision of this area. In review of her mammograms a clip is sitting in a sea of fat, making a missed malignancy highly unlikely. If it will provide piece of mind needle localization open biopsy can be completed if she desires.  She has been on tamoxifen for chemoprevention since 2013 and tolerating this well. We'll plan to continue for at least another year.    The patient has been asked to return to the office in one year with a bilateral diagnostic mammogram.  The patient is of age for a screening colonoscopy. Pros and cons of this diagnostic study were reviewed. The patient will consider her options and let us know how she would like to proceed. PCP:  Hyacinth MeekerMiller This information has been scribed by Ples SpecterJessica Qualls CMA.    Earline MayotteByrnett, Drew Herman W 10/17/2015, 4:33 PM

## 2015-10-17 ENCOUNTER — Encounter: Payer: Self-pay | Admitting: General Surgery

## 2016-08-21 ENCOUNTER — Other Ambulatory Visit: Payer: Self-pay

## 2016-08-21 DIAGNOSIS — N6099 Unspecified benign mammary dysplasia of unspecified breast: Secondary | ICD-10-CM

## 2016-10-10 ENCOUNTER — Other Ambulatory Visit: Payer: BLUE CROSS/BLUE SHIELD

## 2016-10-16 ENCOUNTER — Encounter: Payer: Self-pay | Admitting: *Deleted

## 2016-10-17 ENCOUNTER — Other Ambulatory Visit: Payer: BLUE CROSS/BLUE SHIELD

## 2016-10-17 ENCOUNTER — Ambulatory Visit: Admission: RE | Admit: 2016-10-17 | Payer: BLUE CROSS/BLUE SHIELD | Source: Ambulatory Visit

## 2016-10-21 ENCOUNTER — Ambulatory Visit: Payer: BLUE CROSS/BLUE SHIELD | Admitting: General Surgery

## 2016-11-12 ENCOUNTER — Encounter: Payer: Self-pay | Admitting: *Deleted

## 2016-11-20 ENCOUNTER — Telehealth: Payer: Self-pay | Admitting: *Deleted

## 2016-11-20 ENCOUNTER — Other Ambulatory Visit: Payer: Self-pay | Admitting: General Surgery

## 2016-11-20 NOTE — Telephone Encounter (Signed)
I called the patient and she states she is still taking her tamoxifen (refill sent) and that she is having a had time making her mammogram appointment. I told her we would arrange this and let her know date, appreciates call. Appointment for Dr Lemar LivingsByrnett made for July 3.

## 2016-11-21 ENCOUNTER — Telehealth: Payer: Self-pay

## 2016-11-21 NOTE — Telephone Encounter (Signed)
Message left for patient. [facility] at [facility]:40 pm.

## 2016-11-21 NOTE — Telephone Encounter (Signed)
Patient notified

## 2016-12-18 ENCOUNTER — Ambulatory Visit
Admission: RE | Admit: 2016-12-18 | Discharge: 2016-12-18 | Disposition: A | Discharge status: Home / Self Care | Payer: Managed Care, Other (non HMO) | Source: Ambulatory Visit | Attending: General Surgery | Admitting: General Surgery

## 2016-12-18 DIAGNOSIS — N6099 Unspecified benign mammary dysplasia of unspecified breast: Secondary | ICD-10-CM

## 2016-12-30 ENCOUNTER — Encounter: Payer: Self-pay | Admitting: *Deleted

## 2017-01-06 ENCOUNTER — Ambulatory Visit: Payer: Self-pay | Admitting: General Surgery

## 2017-01-08 ENCOUNTER — Telehealth: Payer: Self-pay

## 2017-01-08 NOTE — Telephone Encounter (Signed)
Message left for the patient to call the office to reschedule her follow up appointment with Dr Lemar LivingsByrnett.

## 2017-04-16 ENCOUNTER — Ambulatory Visit: Payer: Managed Care, Other (non HMO) | Admitting: General Surgery

## 2017-04-27 ENCOUNTER — Telehealth: Payer: Self-pay | Admitting: *Deleted

## 2017-04-27 NOTE — Telephone Encounter (Signed)
Patient called stating she went to her orthopedic doctor and he wants to put her on oral Lamisil, a antifungal medication. She stated that the use of oral Lamisil can cause tamoxifen to not work like it should. Is she still okay to take it or does she need her orthopedic to call her in something else?

## 2017-04-27 NOTE — Telephone Encounter (Signed)
Continue on Tamoxifen for the present. Will discuss in detail at her f/u scheduled for this Thursday.

## 2017-04-30 ENCOUNTER — Ambulatory Visit: Payer: Managed Care, Other (non HMO) | Admitting: General Surgery

## 2017-05-20 ENCOUNTER — Ambulatory Visit: Payer: Managed Care, Other (non HMO) | Admitting: General Surgery

## 2018-01-29 ENCOUNTER — Ambulatory Visit
Admission: EM | Admit: 2018-01-29 | Discharge: 2018-01-29 | Disposition: A | Discharge status: Home / Self Care | Payer: Managed Care, Other (non HMO) | Attending: Family Medicine | Admitting: Family Medicine

## 2018-01-29 DIAGNOSIS — R05 Cough: Secondary | ICD-10-CM

## 2018-01-29 DIAGNOSIS — J0100 Acute maxillary sinusitis, unspecified: Secondary | ICD-10-CM | POA: Diagnosis not present

## 2018-01-29 DIAGNOSIS — R059 Cough, unspecified: Secondary | ICD-10-CM

## 2018-01-29 MED ORDER — FLUCONAZOLE 150 MG PO TABS: 150.0000 mg | ORAL_TABLET | Freq: Every day | ORAL | 0 refills | Status: AC

## 2018-01-29 MED ORDER — AMOXICILLIN 875 MG PO TABS: 875.0000 mg | ORAL_TABLET | Freq: Two times a day (BID) | ORAL | 0 refills | Status: AC

## 2018-01-29 NOTE — ED Triage Notes (Signed)
As per patient cough, congestion, nasal drainage, HA, body aches onset 5 days.

## 2018-01-29 NOTE — ED Provider Notes (Signed)
[facility]    CSN: [id number] Arrival date & time: 01/29/18  1312     History   Chief Complaint Chief Complaint  Patient presents with  . Bronchitis    HPI Stephanie Warner is a 52 y.o. female.   The history is provided by the patient.  URI  Presenting symptoms: congestion, cough, facial pain, fatigue and fever   Severity:  Moderate Onset quality:  Sudden Duration:  1 week Timing:  Constant Progression:  Worsening Chronicity:  New Relieved by:  Nothing Ineffective treatments:  OTC medications Associated symptoms: sinus pain   Associated symptoms: no wheezing   Risk factors: sick contacts   Risk factors: not elderly, no chronic cardiac disease, no chronic kidney disease, no chronic respiratory disease, no diabetes mellitus, no immunosuppression, no recent illness and no recent travel     Past Medical History:  Diagnosis Date  . Atypical ductal hyperplasia of breast 2013  . Hypertension 2013  . Lump or mass in breast     Patient Active Problem List   Diagnosis Date Noted  . Atypical ductal hyperplasia of breast 06/11/2012    Past Surgical History:  Procedure Laterality Date  . BREAST BIOPSY Right 05/2012   atypical ductal hyperplasia  . BREAST BIOPSY Right 08/08/2013   ADH ( 1mm) on stereo biopsy. No wide excision.   Marland Kitchen. BREAST EXCISIONAL BIOPSY Right 06/11/2012   ADH surgery clear margins  . BREAST SURGERY Right 06-11-12   ADH, columnar cell change. Margins were clear. Biopsy prompted by stereotactic biopsy showing ADH.  . CHOLECYSTECTOMY  2009    OB History    Gravida  0   Para      Term      Preterm      AB      Living        SAB      TAB      Ectopic      Multiple      Live Births           Obstetric Comments  MENSTRUAL 12         Home Medications    Prior to Admission medications   Medication Sig Start Date End Date Taking? Authorizing Provider  amLODipine-olmesartan (AZOR) 10-40 MG tablet Take 1 tablet  by mouth daily.   Yes [provider]  aspirin 81 MG tablet Take 81 mg by mouth daily.   Yes [provider]  benzonatate (TESSALON) 200 MG capsule  01/26/18  Yes [provider]  Cholecalciferol (VITAMIN D) 2000 units tablet Take by mouth.   Yes [provider]  Fexofenadine HCl (ALLEGRA PO) Take by mouth.   Yes [provider]  fluticasone (FLONASE) 50 MCG/ACT nasal spray Place into the nose. 03/12/17  Yes [provider]  ibuprofen (ADVIL,MOTRIN) 800 MG tablet Take by mouth.   Yes [provider]  levothyroxine (SYNTHROID, LEVOTHROID) 75 MCG tablet TAKE 1 TABLET BY MOUTH ONCE DAILY, TAKE ON EMPTY STOMACH WITH GLASS OF WATER 30-60 MINS BEFORE BFAST 01/06/18  Yes [provider]  meloxicam (MOBIC) 15 MG tablet TAKE 1 TABLET BY MOUTH EVERY DAY 08/04/17  Yes [provider]  omeprazole (PRILOSEC) 20 MG capsule Take 20 mg by mouth daily.   Yes [provider]  sertraline (ZOLOFT) 50 MG tablet Take 50 mg by mouth daily.   Yes [provider]  valACYclovir (VALTREX) 500 MG tablet Take by mouth.   Yes [provider]  amoxicillin (AMOXIL) 875 MG tablet Take 1 tablet (875 mg total) by mouth 2 (two) times daily. 01/29/18   Payton Mccallum, MD  fluconazole (DIFLUCAN) 150 MG tablet Take 1 tablet (150 mg total) by mouth daily. 01/29/18   Payton Mccallum, MD  tamoxifen (NOLVADEX) 20 MG tablet TAKE ONE TABLET BY MOUTH ONE TIME DAILY 11/20/16   Lemar Livings Merrily Pew, MD    Family History Family History  Problem Relation Age of Onset  . Breast cancer Maternal Aunt 60  . Breast cancer Paternal Aunt 33    Social History Social History   Tobacco Use  . Smoking status: Never Smoker  . Smokeless tobacco: Never Used  Substance Use Topics  . Alcohol use: Yes  . Drug use: No     Allergies   Sulfa antibiotics   Review of Systems Review of Systems  Constitutional: Positive for fatigue and fever.  HENT:  Positive for congestion and sinus pain.   Respiratory: Positive for cough. Negative for wheezing.      Physical Exam Triage Vital Signs ED Triage Vitals  Enc Vitals Group     BP 01/29/18 1331 135/83     Pulse Rate 01/29/18 1331 81     Resp 01/29/18 1331 16     Temp 01/29/18 1331 98 F (36.7 C)     Temp Source 01/29/18 1331 Oral     SpO2 01/29/18 1331 99 %     Weight 01/29/18 1327 166 lb (75.3 kg)     Height 01/29/18 1327 5\' 2"  (1.575 m)     Head Circumference --      Peak Flow --      Pain Score 01/29/18 1327 5     Pain Loc --      Pain Edu? --      Excl. in GC? --    No data found.  Updated Vital Signs BP 135/83 (BP Location: Left Arm)   Pulse 81   Temp 98 F (36.7 C) (Oral)   Resp 16   Ht 5\' 2"  (1.575 m)   Wt 166 lb (75.3 kg)   SpO2 99%   BMI 30.36 kg/m   Visual Acuity Right Eye Distance:   Left Eye Distance:   Bilateral Distance:    Right Eye Near:   Left Eye Near:    Bilateral Near:     Physical Exam  Constitutional: She appears well-developed and well-nourished. No distress.  HENT:  Head: Normocephalic and atraumatic.  Right Ear: Tympanic membrane, external ear and ear canal normal.  Left Ear: Tympanic membrane, external ear and ear canal normal.  Nose: Mucosal edema and rhinorrhea present. No nose lacerations, sinus tenderness, nasal deformity, septal deviation or nasal septal hematoma. No epistaxis.  No foreign bodies. Right sinus exhibits maxillary sinus tenderness and frontal sinus tenderness. Left sinus exhibits maxillary sinus tenderness and frontal sinus tenderness.  Mouth/Throat: Uvula is midline, oropharynx is clear and moist and mucous membranes are normal. No oropharyngeal exudate.  Eyes: Pupils are equal, round, and reactive to light. Conjunctivae and EOM are normal. Right eye exhibits no discharge. Left eye exhibits no discharge. No scleral icterus.  Neck: Normal range of motion. Neck supple. No thyromegaly present.  Cardiovascular: Normal  rate, regular rhythm and normal heart sounds.  Pulmonary/Chest: Effort normal. No stridor. No respiratory distress. She has no wheezes. She has no rales.  Diffuse rhonchi  Lymphadenopathy:    She has no cervical adenopathy.  Skin: She is not diaphoretic.  Nursing note and vitals reviewed.  UC Treatments / Results  Labs (all labs ordered are listed, but only abnormal results are displayed) Labs Reviewed - No data to display  EKG None  Radiology No results found.  Procedures Procedures (including critical care time)  Medications Ordered in UC Medications - No data to display  Initial Impression / Assessment and Plan / UC Course  I have reviewed the triage vital signs and the nursing notes.  Pertinent labs & imaging results that were available during my care of the patient were reviewed by me and considered in my medical decision making (see chart for details).     Final Clinical Impressions(s) / UC Diagnoses   Final diagnoses:  Cough  Acute maxillary sinusitis, recurrence not specified    ED Prescriptions    Medication Sig Dispense Auth. Provider   amoxicillin (AMOXIL) 875 MG tablet Take 1 tablet (875 mg total) by mouth 2 (two) times daily. 20 tablet Payton Mccallum, MD   fluconazole (DIFLUCAN) 150 MG tablet Take 1 tablet (150 mg total) by mouth daily. 1 tablet Payton Mccallum, MD     1. Labs/x-ray results and diagnosis reviewed with patient/parent/guardian/family 2. rx as per orders above; reviewed possible side effects, interactions, risks and benefits  3. Recommend supportive treatment with otc flonase 4. Follow-up prn if symptoms worsen or don't improve  Controlled Substance Prescriptions [place] Controlled Substance Registry consulted? Not Applicable   Payton Mccallum, MD 01/29/18 [phone]

## 2018-03-16 ENCOUNTER — Telehealth: Payer: Self-pay | Admitting: General Surgery

## 2018-03-16 NOTE — Telephone Encounter (Signed)
Patient called and wanted to know if you would order her bilat dx mammo for her. She had her last one done @ [facility] on 12-18-16,but was in able to keep her appointment with Dr Lemar Livings. Please advise.

## 2018-03-19 NOTE — Telephone Encounter (Signed)
Patient has had difficulty making her scheduled appointments in this office, but has been able to see her podiatrist, gynecologist and general internist.  She is outside the 5-year window where she has been scheduled to make use of tamoxifen, and the last GYN note recorded that she had been taking it somewhat less than faithfully.  I think it would be best to have her mammograms completed through an office where she will be planning to have a clinical breast exam to follow this up.

## 2018-03-22 NOTE — Telephone Encounter (Signed)
I have left a message for the patient letting her know she will need to have her primary of GYN order her mammograms per Dr Lemar LivingsByrnett.

## 2019-01-04 ENCOUNTER — Other Ambulatory Visit: Payer: Self-pay | Admitting: Obstetrics & Gynecology

## 2019-01-04 DIAGNOSIS — Z1239 Encounter for other screening for malignant neoplasm of breast: Secondary | ICD-10-CM

## 2019-01-04 DIAGNOSIS — Z8742 Personal history of other diseases of the female genital tract: Secondary | ICD-10-CM

## 2019-01-04 DIAGNOSIS — Z87898 Personal history of other specified conditions: Secondary | ICD-10-CM

## 2019-01-14 ENCOUNTER — Other Ambulatory Visit: Payer: Managed Care, Other (non HMO)

## 2019-01-28 ENCOUNTER — Other Ambulatory Visit: Payer: Self-pay

## 2019-02-08 ENCOUNTER — Other Ambulatory Visit: Payer: Self-pay

## 2019-02-08 ENCOUNTER — Inpatient Hospital Stay: Admission: RE | Admit: 2019-02-08 | Payer: Self-pay | Source: Ambulatory Visit

## 2019-08-19 ENCOUNTER — Other Ambulatory Visit: Payer: Self-pay

## 2019-09-20 ENCOUNTER — Ambulatory Visit
Admission: RE | Admit: 2019-09-20 | Discharge: 2019-09-20 | Disposition: A | Discharge status: Home / Self Care | Payer: BC Managed Care – PPO | Source: Ambulatory Visit | Attending: Obstetrics & Gynecology | Admitting: Obstetrics & Gynecology

## 2019-09-20 DIAGNOSIS — Z87898 Personal history of other specified conditions: Secondary | ICD-10-CM

## 2019-09-20 DIAGNOSIS — Z8742 Personal history of other diseases of the female genital tract: Secondary | ICD-10-CM

## 2019-09-20 DIAGNOSIS — Z1239 Encounter for other screening for malignant neoplasm of breast: Secondary | ICD-10-CM

## 2020-01-16 ENCOUNTER — Other Ambulatory Visit: Payer: Self-pay | Admitting: Neurology

## 2020-01-16 ENCOUNTER — Other Ambulatory Visit (HOSPITAL_COMMUNITY): Payer: Self-pay | Admitting: Neurology

## 2020-01-16 DIAGNOSIS — R4189 Other symptoms and signs involving cognitive functions and awareness: Secondary | ICD-10-CM

## 2020-02-20 ENCOUNTER — Ambulatory Visit: Payer: BC Managed Care – PPO

## 2021-03-12 ENCOUNTER — Other Ambulatory Visit: Payer: Self-pay | Admitting: Internal Medicine

## 2021-03-12 DIAGNOSIS — N6310 Unspecified lump in the right breast, unspecified quadrant: Secondary | ICD-10-CM

## 2021-11-08 ENCOUNTER — Ambulatory Visit
Admission: RE | Admit: 2021-11-08 | Discharge: 2021-11-08 | Disposition: A | Discharge status: Home / Self Care | Payer: BC Managed Care – PPO | Source: Ambulatory Visit | Attending: Internal Medicine | Admitting: Internal Medicine

## 2021-11-08 DIAGNOSIS — N6310 Unspecified lump in the right breast, unspecified quadrant: Secondary | ICD-10-CM | POA: Diagnosis present

## 2023-01-25 IMAGING — MG DIGITAL DIAGNOSTIC BILAT W/ TOMO W/ CAD
8 of 12 series · 8 of 32 positions shown · non-contrast
Comparison: Previous exam(s).

CLINICAL DATA: 56-year-old female presenting for annual exam.
History of biopsy proven ADH within the outer right breast. Patient
has opted for surveillance over excision.

EXAM:
DIGITAL DIAGNOSTIC BILATERAL MAMMOGRAM WITH TOMOSYNTHESIS AND CAD
TECHNIQUE: Bilateral digital diagnostic mammography and breast tomosynthesis
was performed. The images were evaluated with computer-aided
detection.

[R CC]
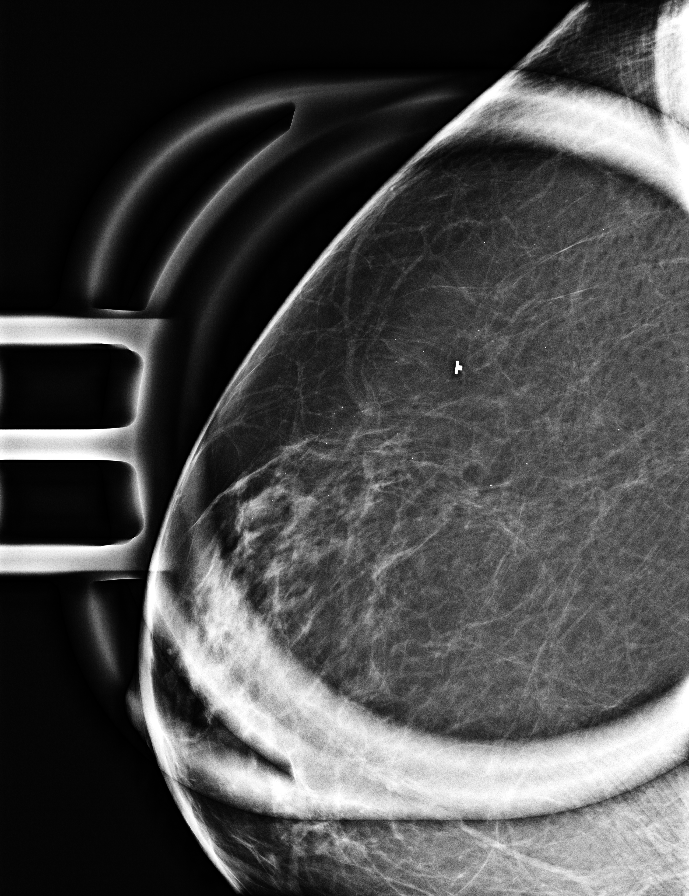

[R ML]
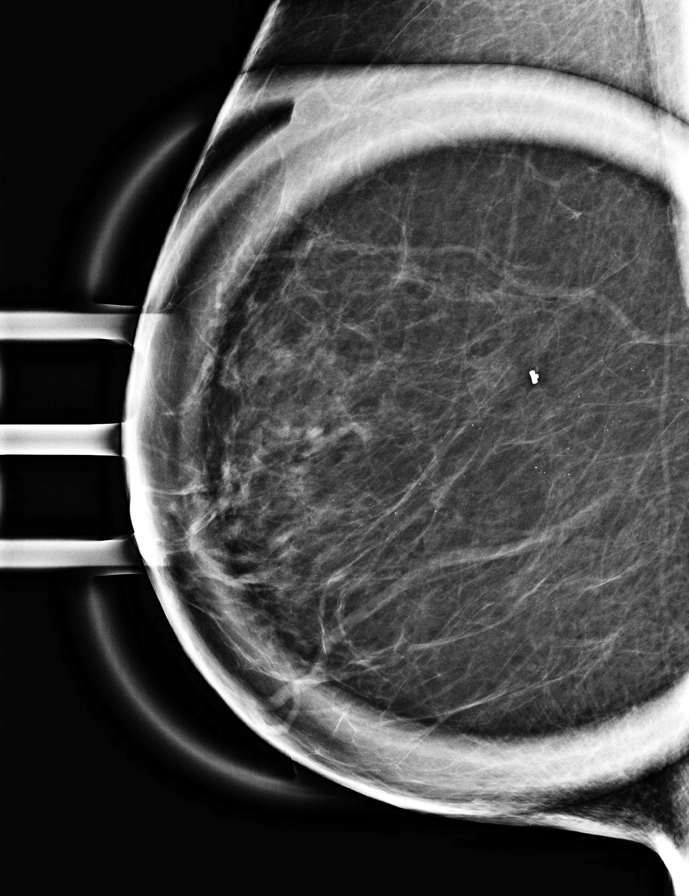

[L CC synth-2D]
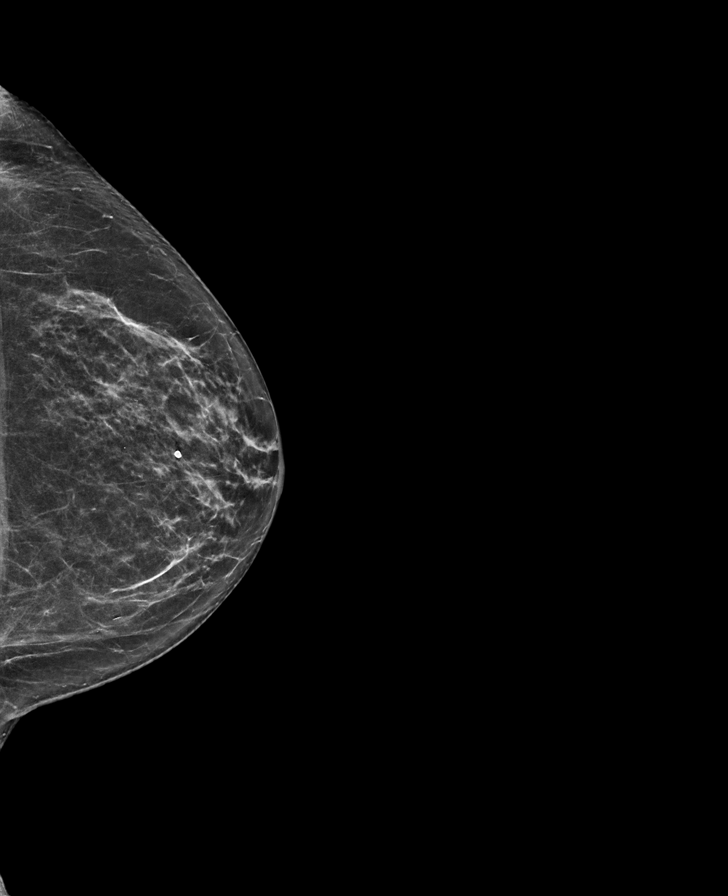

[L MLO synth-2D (1 of 2)]
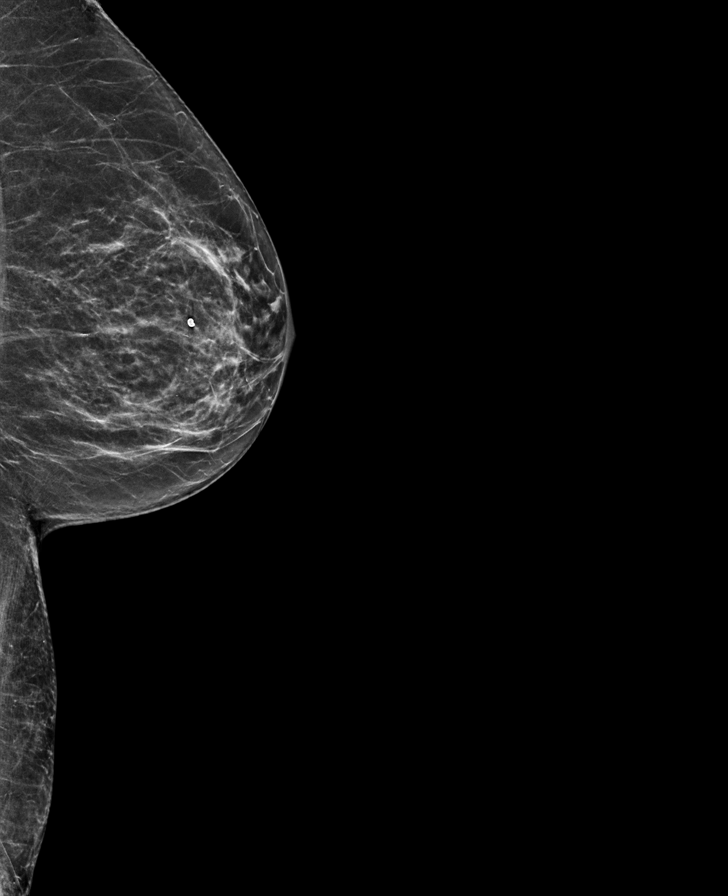

[L MLO synth-2D (2 of 2)]
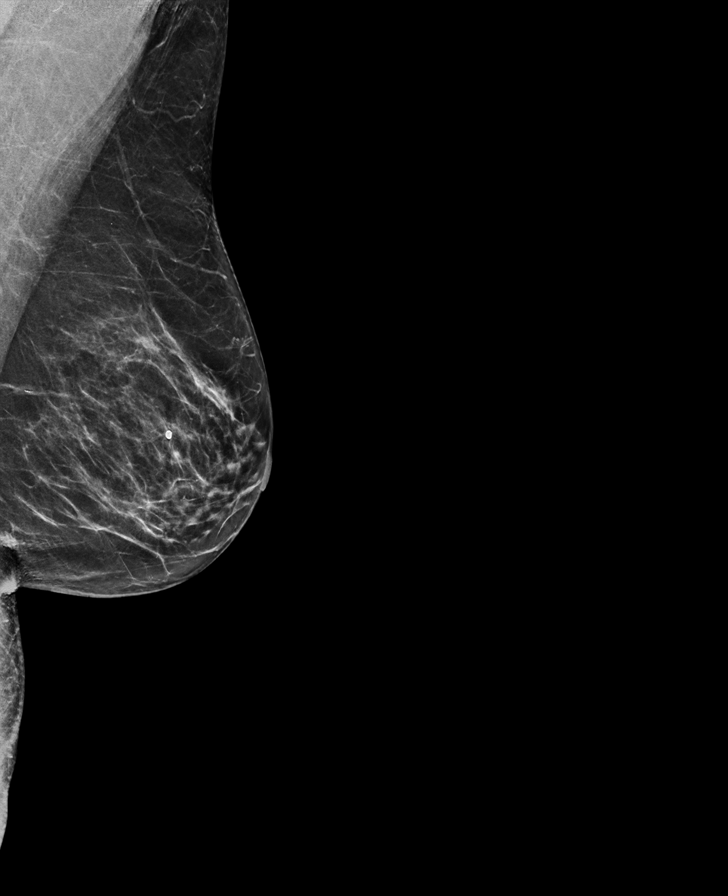

[R CC synth-2D]
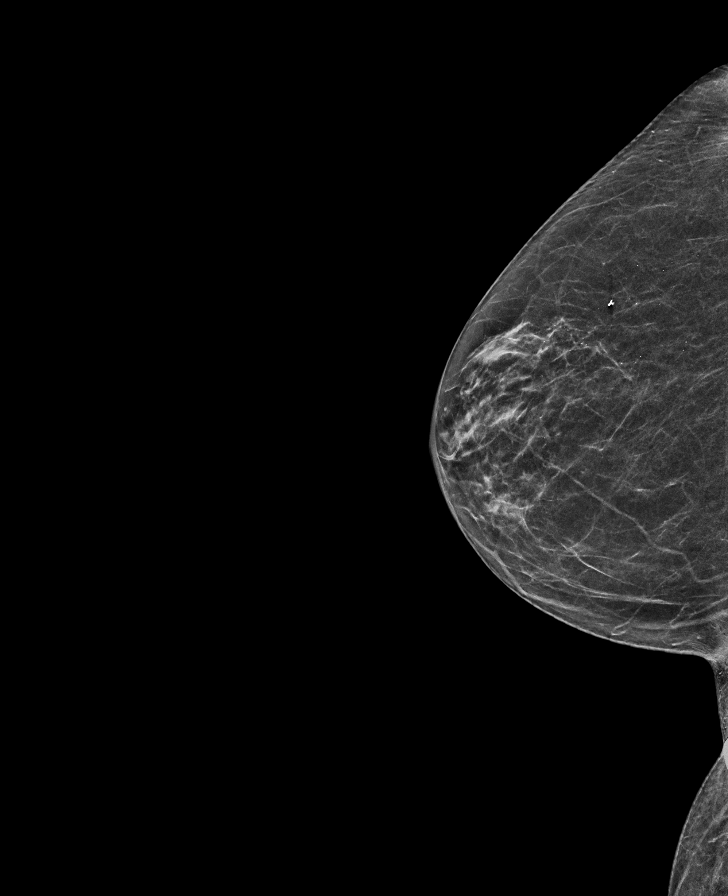

[R MLO synth-2D]
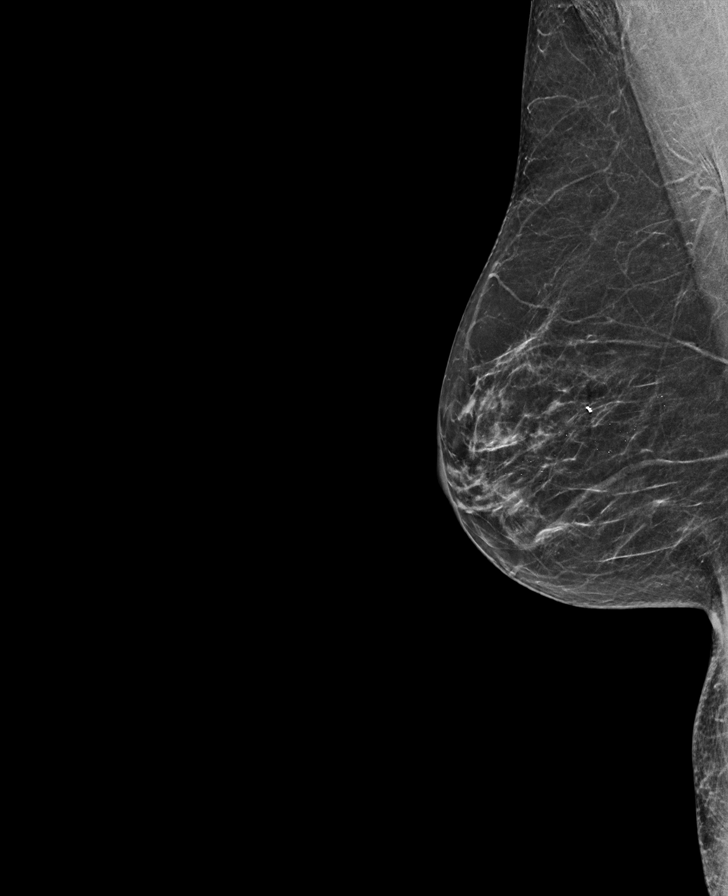

[L MLO tomo · tomo slice 33/64.0]
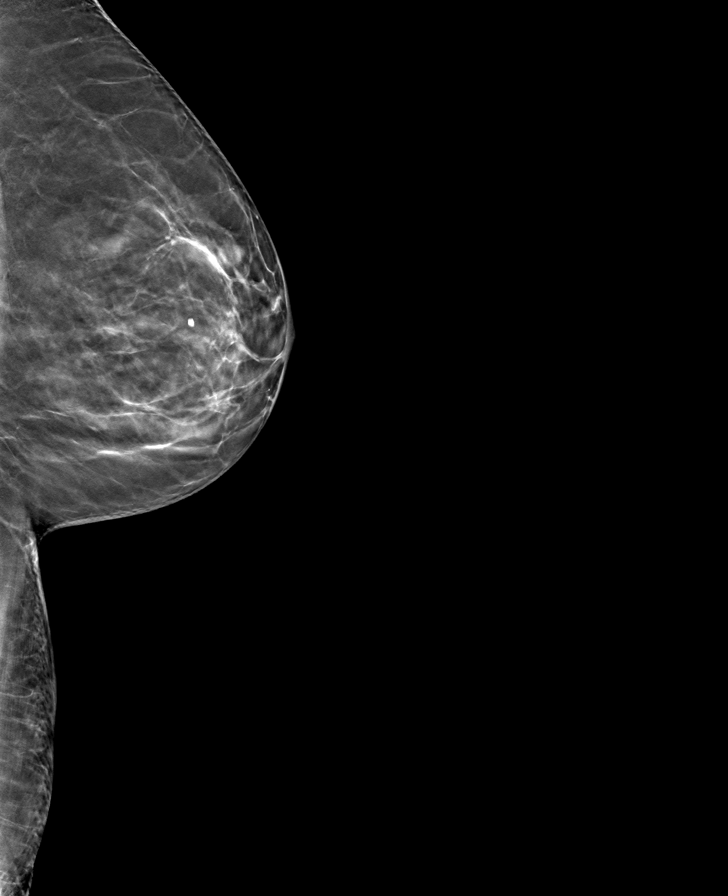

[8 of 32 positions shown; findings below may reference images not displayed]

ACR Breast Density Category b: There are scattered areas of
fibroglandular density.
FINDINGS: Right breast: Biopsy clip again noted within the outer right breast.
There is no new suspicious mass or microcalcification. Scattered
punctate calcifications are again noted. There are no new suspicious
findings elsewhere in the right breast.

Left breast: No suspicious mass, distortion, or microcalcifications
are identified to suggest presence of malignancy.
IMPRESSION: 1.  Stable biopsy site in the outer right breast.

2.  No mammographic evidence of malignancy bilaterally.

RECOMMENDATION:
Diagnostic bilateral mammogram in 1 year.

I have discussed the findings and recommendations with the patient.
If applicable, a reminder letter will be sent to the patient
regarding the next appointment.

BI-RADS CATEGORY  2: Benign.

## 2024-02-01 ENCOUNTER — Other Ambulatory Visit: Payer: Self-pay | Admitting: Internal Medicine

## 2024-02-01 DIAGNOSIS — E782 Mixed hyperlipidemia: Secondary | ICD-10-CM

## 2024-02-01 DIAGNOSIS — Z0000 Encounter for general adult medical examination without abnormal findings: Secondary | ICD-10-CM

## 2024-04-01 ENCOUNTER — Other Ambulatory Visit

## 2024-04-18 ENCOUNTER — Other Ambulatory Visit (HOSPITAL_COMMUNITY): Payer: Self-pay

## 2024-05-06 ENCOUNTER — Other Ambulatory Visit

## 2024-05-27 ENCOUNTER — Ambulatory Visit: Attending: Internal Medicine
